# Patient Record
Sex: Female | Born: 1965 | Race: White | Hispanic: No | Smoking: Current every day smoker
Health system: Southern US, Community
[De-identification: ages and names within clinical notes are randomized; demographics above are authoritative.]

## PROBLEM LIST (undated history)

## (undated) DIAGNOSIS — F32A Depression, unspecified: Secondary | ICD-10-CM

## (undated) DIAGNOSIS — F419 Anxiety disorder, unspecified: Secondary | ICD-10-CM

## (undated) DIAGNOSIS — F329 Major depressive disorder, single episode, unspecified: Secondary | ICD-10-CM

## (undated) HISTORY — PX: ABDOMINAL HYSTERECTOMY: SHX81

## (undated) HISTORY — PX: TONSILLECTOMY: SUR1361

## (undated) HISTORY — PX: TUBAL LIGATION: SHX77

---

## 2015-03-27 ENCOUNTER — Emergency Department (HOSPITAL_BASED_OUTPATIENT_CLINIC_OR_DEPARTMENT_OTHER)
Admission: EM | Admit: 2015-03-27 | Discharge: 2015-03-27 | Disposition: A | Discharge status: Home / Self Care | Payer: Self-pay | Attending: Emergency Medicine | Admitting: Emergency Medicine

## 2015-03-27 ENCOUNTER — Emergency Department (HOSPITAL_BASED_OUTPATIENT_CLINIC_OR_DEPARTMENT_OTHER): Payer: Self-pay

## 2015-03-27 ENCOUNTER — Encounter (HOSPITAL_BASED_OUTPATIENT_CLINIC_OR_DEPARTMENT_OTHER): Payer: Self-pay

## 2015-03-27 DIAGNOSIS — Z79899 Other long term (current) drug therapy: Secondary | ICD-10-CM | POA: Insufficient documentation

## 2015-03-27 DIAGNOSIS — Y9289 Other specified places as the place of occurrence of the external cause: Secondary | ICD-10-CM | POA: Insufficient documentation

## 2015-03-27 DIAGNOSIS — Y998 Other external cause status: Secondary | ICD-10-CM | POA: Insufficient documentation

## 2015-03-27 DIAGNOSIS — W1789XA Other fall from one level to another, initial encounter: Secondary | ICD-10-CM | POA: Insufficient documentation

## 2015-03-27 DIAGNOSIS — S3097XA Unspecified superficial injury of unspecified external genital organs, female, initial encounter: Secondary | ICD-10-CM | POA: Insufficient documentation

## 2015-03-27 DIAGNOSIS — S3992XA Unspecified injury of lower back, initial encounter: Secondary | ICD-10-CM | POA: Insufficient documentation

## 2015-03-27 DIAGNOSIS — Y9389 Activity, other specified: Secondary | ICD-10-CM | POA: Insufficient documentation

## 2015-03-27 HISTORY — DX: Anxiety disorder, unspecified: F41.9

## 2015-03-27 HISTORY — DX: Depression, unspecified: F32.A

## 2015-03-27 HISTORY — DX: Major depressive disorder, single episode, unspecified: F32.9

## 2015-03-27 MED ORDER — OXYCODONE-ACETAMINOPHEN 5-325 MG PO TABS: 2.0000 | ORAL_TABLET | Freq: Once | ORAL | Status: DC

## 2015-03-27 MED ORDER — OXYCODONE-ACETAMINOPHEN 5-325 MG PO TABS: 1.0000 | ORAL_TABLET | Freq: Four times a day (QID) | ORAL | Status: AC | PRN

## 2015-03-27 NOTE — ED Provider Notes (Signed)
CSN: [id number]     Arrival date & time 03/27/15  2056 History  By signing my name below, I, Tanda Rockers, attest that this documentation has been prepared under the direction and in the presence of Elwin Mocha, MD. Electronically Signed: Tanda Rockers, ED Scribe. 03/27/2015. 10:05 PM.    Chief Complaint  Patient presents with  . Fall   Patient is a 49 y.o. female presenting with fall. The history is provided by the patient. No language interpreter was used.  Fall This is a new problem. The current episode started 2 days ago. The problem occurs rarely. The problem has not changed since onset.Pertinent negatives include no chest pain, no abdominal pain, no headaches and no shortness of breath. She has tried nothing for the symptoms. The treatment provided no relief.     HPI Comments: Takeyah Wieman is a 48 y.o. female who presents to the Emergency Department complaining of sudden onset, constant, severe, buttock pain and perineum pain s/p 5 foot fall that occurred 2 days ago. Pt was getting off of a tractor trailer when she fell and landed onto her buttocks. No head injury or LOC. She is having pain with bowel movements and urination but denies any issues. She also complains of mild lower back pain. Denies vaginal bleeding, numbness, weakness, tingling, loss of bladder or bowel.   Past Medical History  Diagnosis Date  . Anxiety   . Depression    Past Surgical History  Procedure Laterality Date  . Abdominal hysterectomy    . Tonsillectomy    . Tubal ligation     No family history on file. Social History  Substance Use Topics  . Smoking status: None  . Smokeless tobacco: None  . Alcohol Use: None   OB History    No data available     Review of Systems  Constitutional: Negative for fever and chills.  Respiratory: Negative for shortness of breath.   Cardiovascular: Negative for chest pain.  Gastrointestinal: Negative for abdominal pain.       No bowel incontinence   Genitourinary: Negative for hematuria, vaginal bleeding, difficulty urinating and vaginal pain.       + Perineum pain No urinary incontinence  Musculoskeletal: Positive for back pain and arthralgias (Buttock pain. ).  Neurological: Negative for syncope, weakness, numbness and headaches.  All other systems reviewed and are negative.   Allergies  Review of patient's allergies indicates no known allergies.  Home Medications   Prior to Admission medications   Medication Sig Start Date End Date Taking? Authorizing Provider  lisdexamfetamine (VYVANSE) 40 MG capsule Take 40 mg by mouth daily.   Yes Historical Provider, MD   Triage Vitals: BP 136/74 mmHg  Pulse 113  Temp(Src) 98.9 F (37.2 C) (Oral)  Resp 20  Ht  (1.626 m)  Wt 146 lb (66.225 kg)  BMI 25.05 kg/m2  SpO2 99%   Physical Exam  Constitutional: She is oriented to person, place, and time. She appears well-developed and well-nourished. No distress.  HENT:  Head: Normocephalic and atraumatic.  Mouth/Throat: Oropharynx is clear and moist.  Eyes: EOM are normal. Pupils are equal, round, and reactive to light.  Neck: Normal range of motion. Neck supple.  Cardiovascular: Normal rate and regular rhythm.  Exam reveals no friction rub.   No murmur heard. Pulmonary/Chest: Effort normal and breath sounds normal. No respiratory distress. She has no wheezes. She has no rales.  Abdominal: Soft. She exhibits no distension. There is no tenderness. There is no  rebound.  Musculoskeletal: Normal range of motion. She exhibits no edema.       Lumbar back: She exhibits no tenderness and no bony tenderness.       Back:  Neurological: She is alert and oriented to person, place, and time.  Skin: She is not diaphoretic.  Nursing note and vitals reviewed.   ED Course  Procedures (including critical care time)  DIAGNOSTIC STUDIES: Oxygen Saturation is 99% on RA, normal by my interpretation.    COORDINATION OF CARE: 9:33 PM-Discussed  treatment plan which includes DG Sacrum/Coccyx and pain medication with pt at bedside and pt agreed to plan.   Labs Review Labs Reviewed - No data to display  Imaging Review Dg Sacrum/coccyx  03/27/2015   CLINICAL DATA:  Fall  EXAM: SACRUM AND COCCYX - 2+ VIEW  COMPARISON:  None.  FINDINGS: No fracture.  No dislocation.  IMPRESSION: No acute bony pathology.   Electronically Signed   By: Jolaine Click M.D.   On: 03/27/2015 22:05   I have personally reviewed and evaluated these images as part of my medical decision-making.   EKG Interpretation None      MDM   Final diagnoses:  Tailbone injury, initial encounter    49 year old female here with a fall. She fell 5 feet and landed on her buttocks. She's having a lot of tailbone pain ever since. Follows 3 days ago. Denies any urinary incontinence or fecal incontinence. No urinary or fecal retention. No lumbar back pain. No numbness or tingling in her legs. She is having mostly pain in her lower coccyx and perineal area. Normal lower extremity exam. Normal strength and sensation. She has no lumbar pain. She has had pain in her upper coccyx area. X-rays normal. Given small amount pain medicine. She stable for discharge. No concerns for cord compression  I personally performed the services described in this documentation, which was scribed in my presence. The recorded information has been reviewed and is accurate.      Elwin Mocha, MD 03/27/15 [phone]

## 2015-03-27 NOTE — ED Notes (Signed)
percocet order canceled d/t no ride

## 2015-03-27 NOTE — ED Notes (Signed)
Pt fell on Monday from a tractor trailer approximately 5 feet onto her bottom on Monday, c/o severe pain in butt and back, steady gait, but obvious pain with ambulation.  No incontinence, no numbness in extremities.

## 2017-04-27 ENCOUNTER — Emergency Department (HOSPITAL_BASED_OUTPATIENT_CLINIC_OR_DEPARTMENT_OTHER): Payer: Self-pay

## 2017-04-27 ENCOUNTER — Emergency Department (HOSPITAL_BASED_OUTPATIENT_CLINIC_OR_DEPARTMENT_OTHER)
Admission: EM | Admit: 2017-04-27 | Discharge: 2017-04-27 | Disposition: A | Discharge status: Home / Self Care | Payer: Self-pay | Attending: Emergency Medicine | Admitting: Emergency Medicine

## 2017-04-27 ENCOUNTER — Encounter (HOSPITAL_BASED_OUTPATIENT_CLINIC_OR_DEPARTMENT_OTHER): Payer: Self-pay | Admitting: *Deleted

## 2017-04-27 ENCOUNTER — Other Ambulatory Visit: Payer: Self-pay

## 2017-04-27 DIAGNOSIS — S20212A Contusion of left front wall of thorax, initial encounter: Secondary | ICD-10-CM | POA: Insufficient documentation

## 2017-04-27 DIAGNOSIS — Z791 Long term (current) use of non-steroidal anti-inflammatories (NSAID): Secondary | ICD-10-CM | POA: Insufficient documentation

## 2017-04-27 DIAGNOSIS — Y939 Activity, unspecified: Secondary | ICD-10-CM | POA: Insufficient documentation

## 2017-04-27 DIAGNOSIS — Y999 Unspecified external cause status: Secondary | ICD-10-CM | POA: Insufficient documentation

## 2017-04-27 DIAGNOSIS — F17200 Nicotine dependence, unspecified, uncomplicated: Secondary | ICD-10-CM | POA: Insufficient documentation

## 2017-04-27 DIAGNOSIS — Z79899 Other long term (current) drug therapy: Secondary | ICD-10-CM | POA: Insufficient documentation

## 2017-04-27 DIAGNOSIS — Y92410 Unspecified street and highway as the place of occurrence of the external cause: Secondary | ICD-10-CM | POA: Insufficient documentation

## 2017-04-27 LAB — CBC
HCT: 38.4 % (ref 36.0–46.0)
Hemoglobin: 12.6 g/dL (ref 12.0–15.0)
MCH: 31.3 pg (ref 26.0–34.0)
MCHC: 32.8 g/dL (ref 30.0–36.0)
MCV: 95.5 fL (ref 78.0–100.0)
PLATELETS: 392 10*3/uL (ref 150–400)
RBC: 4.02 MIL/uL (ref 3.87–5.11)
RDW: 13.1 % (ref 11.5–15.5)
WBC: 7.8 10*3/uL (ref 4.0–10.5)

## 2017-04-27 LAB — BASIC METABOLIC PANEL
Anion gap: 8 (ref 5–15)
BUN: 15 mg/dL (ref 6–20)
CALCIUM: 9.8 mg/dL (ref 8.9–10.3)
CHLORIDE: 102 mmol/L (ref 101–111)
CO2: 26 mmol/L (ref 22–32)
CREATININE: 0.83 mg/dL (ref 0.44–1.00)
GFR calc non Af Amer: >60 mL/min (ref >60)
GLUCOSE: 90 mg/dL (ref 65–99)
Potassium: 3.6 mmol/L (ref 3.5–5.1)
Sodium: 136 mmol/L (ref 135–145)

## 2017-04-27 LAB — TROPONIN I

## 2017-04-27 NOTE — ED Notes (Signed)
Patient states that this workers comp. The patient states that she does not need a UDS

## 2017-04-27 NOTE — ED Notes (Signed)
Patient called this RN into the rooms ' Very paniced and appeared to be anxious states " someone needs to do something about this chest pain, everyone in my family under the age of 50 has had a massive heart attack" - Patient is rubbing her chest and states that it radiates up into her right shoulder. The patient states "I dont care what those things say I have chest pain"
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
## 2017-06-24 ENCOUNTER — Other Ambulatory Visit: Payer: Self-pay

## 2017-06-24 ENCOUNTER — Encounter (HOSPITAL_BASED_OUTPATIENT_CLINIC_OR_DEPARTMENT_OTHER): Payer: Self-pay | Admitting: *Deleted

## 2017-06-24 ENCOUNTER — Emergency Department (HOSPITAL_BASED_OUTPATIENT_CLINIC_OR_DEPARTMENT_OTHER)
Admission: EM | Admit: 2017-06-24 | Discharge: 2017-06-24 | Disposition: A | Discharge status: Home / Self Care | Payer: Self-pay | Attending: Emergency Medicine | Admitting: Emergency Medicine

## 2017-06-24 ENCOUNTER — Emergency Department (HOSPITAL_BASED_OUTPATIENT_CLINIC_OR_DEPARTMENT_OTHER): Payer: Self-pay

## 2017-06-24 DIAGNOSIS — Z79899 Other long term (current) drug therapy: Secondary | ICD-10-CM | POA: Insufficient documentation

## 2017-06-24 DIAGNOSIS — F329 Major depressive disorder, single episode, unspecified: Secondary | ICD-10-CM | POA: Insufficient documentation

## 2017-06-24 DIAGNOSIS — F17200 Nicotine dependence, unspecified, uncomplicated: Secondary | ICD-10-CM | POA: Insufficient documentation

## 2017-06-24 DIAGNOSIS — B349 Viral infection, unspecified: Secondary | ICD-10-CM | POA: Insufficient documentation

## 2017-06-24 DIAGNOSIS — F419 Anxiety disorder, unspecified: Secondary | ICD-10-CM | POA: Insufficient documentation

## 2017-06-24 MED ORDER — BENZONATATE 100 MG PO CAPS
200.0000 mg | ORAL_CAPSULE | Freq: Once | ORAL | Status: AC
  Administered 2017-06-24: 200 mg via ORAL
  Filled 2017-06-24: qty 2

## 2017-06-24 MED ORDER — IPRATROPIUM-ALBUTEROL 0.5-2.5 (3) MG/3ML IN SOLN
3.0000 mL | Freq: Once | RESPIRATORY_TRACT | Status: AC
  Administered 2017-06-24: 3 mL via RESPIRATORY_TRACT

## 2017-06-24 MED ORDER — PREDNISONE 10 MG (21) PO TBPK: ORAL_TABLET | ORAL | 0 refills | Status: AC

## 2017-06-24 MED ORDER — ALBUTEROL SULFATE HFA 108 (90 BASE) MCG/ACT IN AERS: 2.0000 | INHALATION_SPRAY | RESPIRATORY_TRACT | 0 refills | Status: AC | PRN

## 2017-06-24 MED ORDER — PREDNISONE 50 MG PO TABS
60.0000 mg | ORAL_TABLET | Freq: Once | ORAL | Status: AC
  Administered 2017-06-24: 60 mg via ORAL
  Filled 2017-06-24: qty 1

## 2017-06-24 MED ORDER — BENZONATATE 100 MG PO CAPS: 100.0000 mg | ORAL_CAPSULE | Freq: Three times a day (TID) | ORAL | 0 refills | Status: AC

## 2017-06-24 NOTE — Discharge Instructions (Addendum)
Your symptoms are consistent with a viral illness. Viruses do not require antibiotics. Treatment is symptomatic care and it is important to note that these symptoms may last for 7-14 days.   Hand washing: Wash your hands throughout the day, but especially before and after touching the face, using the restroom, sneezing, coughing, or touching surfaces that have been coughed or sneezed upon. Hydration: Symptoms will be intensified and complicated by dehydration. Dehydration can also extend the duration of symptoms. Drink plenty of fluids and get plenty of rest. You should be drinking at least half a liter of water an hour to stay hydrated. Electrolyte drinks (ex. Gatorade, Powerade, Pedialyte) are also encouraged. You should be drinking enough fluids to make your urine light yellow, almost clear. If this is not the case, you are not drinking enough water. Please note that some of the treatments indicated below will not be effective if you are not adequately hydrated. Diet: Please concentrate on hydration, however, you may introduce food slowly.  Start with a clear liquid diet, progressed to a full liquid diet, and then bland solids as you are able. Pain or fever: Ibuprofen, Naproxen, or Tylenol for pain or fever.  Cough: Use the Tessalon for cough.  Albuterol: Take the albuterol, as needed, for minor shortness of breath. Prednisone: Take the prednisone, as directed, in its entirety. Zyrtec or Claritin: May add these medication daily to control underlying symptoms of congestion, sneezing, and other signs of allergies. Flonase: Use this medication, as directed, for nasal and sinus congestion. Congestion: Plain Mucinex may help relieve congestion. Saline sinus rinses and saline nasal sprays may also help relieve congestion. If you do not have heart problems or an allergy to such medications, you may also try phenylephrine or Sudafed. Sore throat: Warm liquids or Chloraseptic spray may help soothe a sore  throat. Gargle twice a day with a salt water solution made from a half teaspoon of salt in a cup of warm water.  Follow up: Follow up with a primary care provider, as needed, for any future management of this issue. Return: Return to the ED immediately for worsening symptoms including shortness of breath, chest pain, or other major concerns.

## 2017-06-24 NOTE — ED Triage Notes (Signed)
Pt c/o URi symptoms  x 1 week DX pneumonia x 1 week ago at PMD completed ABX , prednisone

## 2017-06-24 NOTE — ED Provider Notes (Signed)
[facility] EMERGENCY DEPARTMENT Provider Note   CSN: [id number] Arrival date & time: 06/24/17  1517     History   Chief Complaint Chief Complaint  Patient presents with  . URI    HPI Janaa Acero is a 52 y.o. female.  HPI   Amneet Cendejas is a 52 y.o. female, with a history of anxiety and depression, presenting to the ED with persistent cough for the last week. Accompanied by subjective fever, body aches, and nasal congestion.  Saw her PCP last week, was clinically diagnosed with pneumonia, and prescribed azithromycin, prednisone burst, and albuterol nebulizer. States she felt like she was getting better and then symptoms worsened. Poor fluid intake. Has been using the albuterol inhaler twice a day.   Denies N/V/D, shortness of breath, CP, abdominal pain, rash, peripheral edema, or any other complaints.    Past Medical History:  Diagnosis Date  . Anxiety   . Depression     There are no active problems to display for this patient.   Past Surgical History:  Procedure Laterality Date  . ABDOMINAL HYSTERECTOMY    . TONSILLECTOMY    . TUBAL LIGATION      OB History    No data available       Home Medications    Prior to Admission medications   Medication Sig Start Date End Date Taking? Authorizing Provider  albuterol (PROVENTIL HFA;VENTOLIN HFA) 108 (90 Base) MCG/ACT inhaler Inhale 2 puffs into the lungs every 4 (four) hours as needed for wheezing or shortness of breath. 06/24/17   Williamson Cavanah C, PA-C  benzonatate (TESSALON) 100 MG capsule Take 1 capsule (100 mg total) by mouth every 8 (eight) hours. 06/24/17   Sylvan Lahm C, PA-C  FLUoxetine HCl (PROZAC PO) Take by mouth.    [provider]  lisdexamfetamine (VYVANSE) 40 MG capsule Take 40 mg by mouth daily.    [provider]  Meloxicam (MOBIC PO) Take by mouth.    [provider]  oxyCODONE-acetaminophen (PERCOCET) 5-325 MG tablet Take 1 tablet by mouth every 6 (six) hours as  needed for moderate pain. 03/27/15   Elwin Mocha, MD  predniSONE (STERAPRED UNI-PAK 21 TAB) 10 MG (21) TBPK tablet Take 6 tabs (60mg ) on day 1, 5 tabs (50mg ) on day 2, 4 tabs (40mg ) on day 3, 3 tabs (30mg ) on day 4, 2 tabs (20mg ) on day 5, and 1 tab (10mg ) on day 6. 06/24/17   Halei Hanover, Hillard Danker, PA-C    Family History History reviewed. No pertinent family history.  Social History Social History   Tobacco Use  . Smoking status: Current Every Day Smoker    Packs/day: 1.00  . Smokeless tobacco: Never Used  Substance Use Topics  . Alcohol use: Yes  . Drug use: No     Allergies   Patient has no known allergies.   Review of Systems Review of Systems  Constitutional: Positive for chills and fever (subjective).  Respiratory: Positive for cough. Negative for shortness of breath.   Cardiovascular: Negative for chest pain.  Gastrointestinal: Negative for abdominal pain, diarrhea, nausea and vomiting.  Musculoskeletal: Positive for myalgias.  All other systems reviewed and are negative.    Physical Exam Updated Vital Signs BP (!) 142/99   Pulse (!) 104   Temp 98.7 F (37.1 C) (Oral)   Resp 16   Ht 5\' 4"  (1.626 m)   Wt 63.5 kg (140 lb)   SpO2 97%   BMI 24.03 kg/m  Physical Exam  Constitutional: She appears well-developed and well-nourished. No distress.  HENT:  Head: Normocephalic and atraumatic.  Eyes: Conjunctivae are normal.  Neck: Neck supple.  Cardiovascular: Normal rate, regular rhythm, normal heart sounds and intact distal pulses.  Pulmonary/Chest: Effort normal. No respiratory distress. She has wheezes (global).  Coughing, but no increased work of breathing. Speaks in full sentences without difficulty.   Abdominal: Soft. There is no tenderness. There is no guarding.  Musculoskeletal: She exhibits no edema.  Lymphadenopathy:    She has no cervical adenopathy.  Neurological: She is alert.  Skin: Skin is warm and dry. She is not diaphoretic.  Psychiatric: She has a  normal mood and affect. Her behavior is normal.  Nursing note and vitals reviewed.    ED Treatments / Results  Labs (all labs ordered are listed, but only abnormal results are displayed) Labs Reviewed - No data to display  EKG  EKG Interpretation None       Radiology Dg Chest 2 View  Result Date: 06/24/2017 CLINICAL DATA:  Cough.  Dyspnea.  Recent pneumonia. EXAM: CHEST  2 VIEW COMPARISON:  04/27/2017 chest radiograph. FINDINGS: Stable cardiomediastinal silhouette with normal heart size. No pneumothorax. No pleural effusion. Lungs appear clear, with no acute consolidative airspace disease and no pulmonary edema. IMPRESSION: No active cardiopulmonary disease. Electronically Signed   By: Delbert Phenix M.D.   On: 06/24/2017 15:52    Procedures Procedures (including critical care time)  Medications Ordered in ED Medications  ipratropium-albuterol (DUONEB) 0.5-2.5 (3) MG/3ML nebulizer solution 3 mL (3 mLs Nebulization Given 06/24/17 1735)  predniSONE (DELTASONE) tablet 60 mg (60 mg Oral Given 06/24/17 1727)  benzonatate (TESSALON) capsule 200 mg (200 mg Oral Given 06/24/17 1727)     Initial Impression / Assessment and Plan / ED Course  I have reviewed the triage vital signs and the nursing notes.  Pertinent labs & imaging results that were available during my care of the patient were reviewed by me and considered in my medical decision making (see chart for details).  Clinical Course as of Jun 26 43  Morey Hummingbird Jun 24, 2017  1810 Patient states she feels better with her breathing following the DuoNeb treatment.  Wheezing has resolved.  Patient states she is ready for discharge.  [SJ]    Clinical Course User Index [SJ] Essa Wenk C, PA-C   Patient presents with congestion and cough.  Has already completed a course of antibiotics.  Suspect viral origin.  Patient is nontoxic appearing, afebrile, not tachycardic, not tachypneic, not hypotensive, maintains SPO2 of 98-100% on room air, and is  in no apparent distress. PCP follow-up. The patient was given instructions for home care as well as return precautions. Patient voices understanding of these instructions, accepts the plan, and is comfortable with discharge.     Vitals:   06/24/17 1740 06/24/17 1741 06/24/17 1813 06/24/17 1815  BP:    122/76  Pulse: 92 97 92   Resp:      Temp:      TempSrc:      SpO2: 100% 100% 98%   Weight:      Height:         Final Clinical Impressions(s) / ED Diagnoses   Final diagnoses:  Viral syndrome    ED Discharge Orders        Ordered    benzonatate (TESSALON) 100 MG capsule  Every 8 hours     06/24/17 1813    albuterol (PROVENTIL HFA;VENTOLIN HFA) 108 (90  Base) MCG/ACT inhaler  Every 4 hours PRN     06/24/17 1813    predniSONE (STERAPRED UNI-PAK 21 TAB) 10 MG (21) TBPK tablet     06/24/17 1813       Anselm PancoastJoy, Cuahutemoc Attar C, PA-C 06/25/17 0047    Rolland PorterJames, Mark, MD 06/26/17 2229

## 2019-09-11 IMAGING — CR DG CHEST 2V
2 series · 2 of 2 positions shown · non-contrast
Comparison: 04/27/2017 chest radiograph.

CLINICAL DATA: Cough.  Dyspnea.  Recent pneumonia.

EXAM:
CHEST  2 VIEW

[w chest pa]
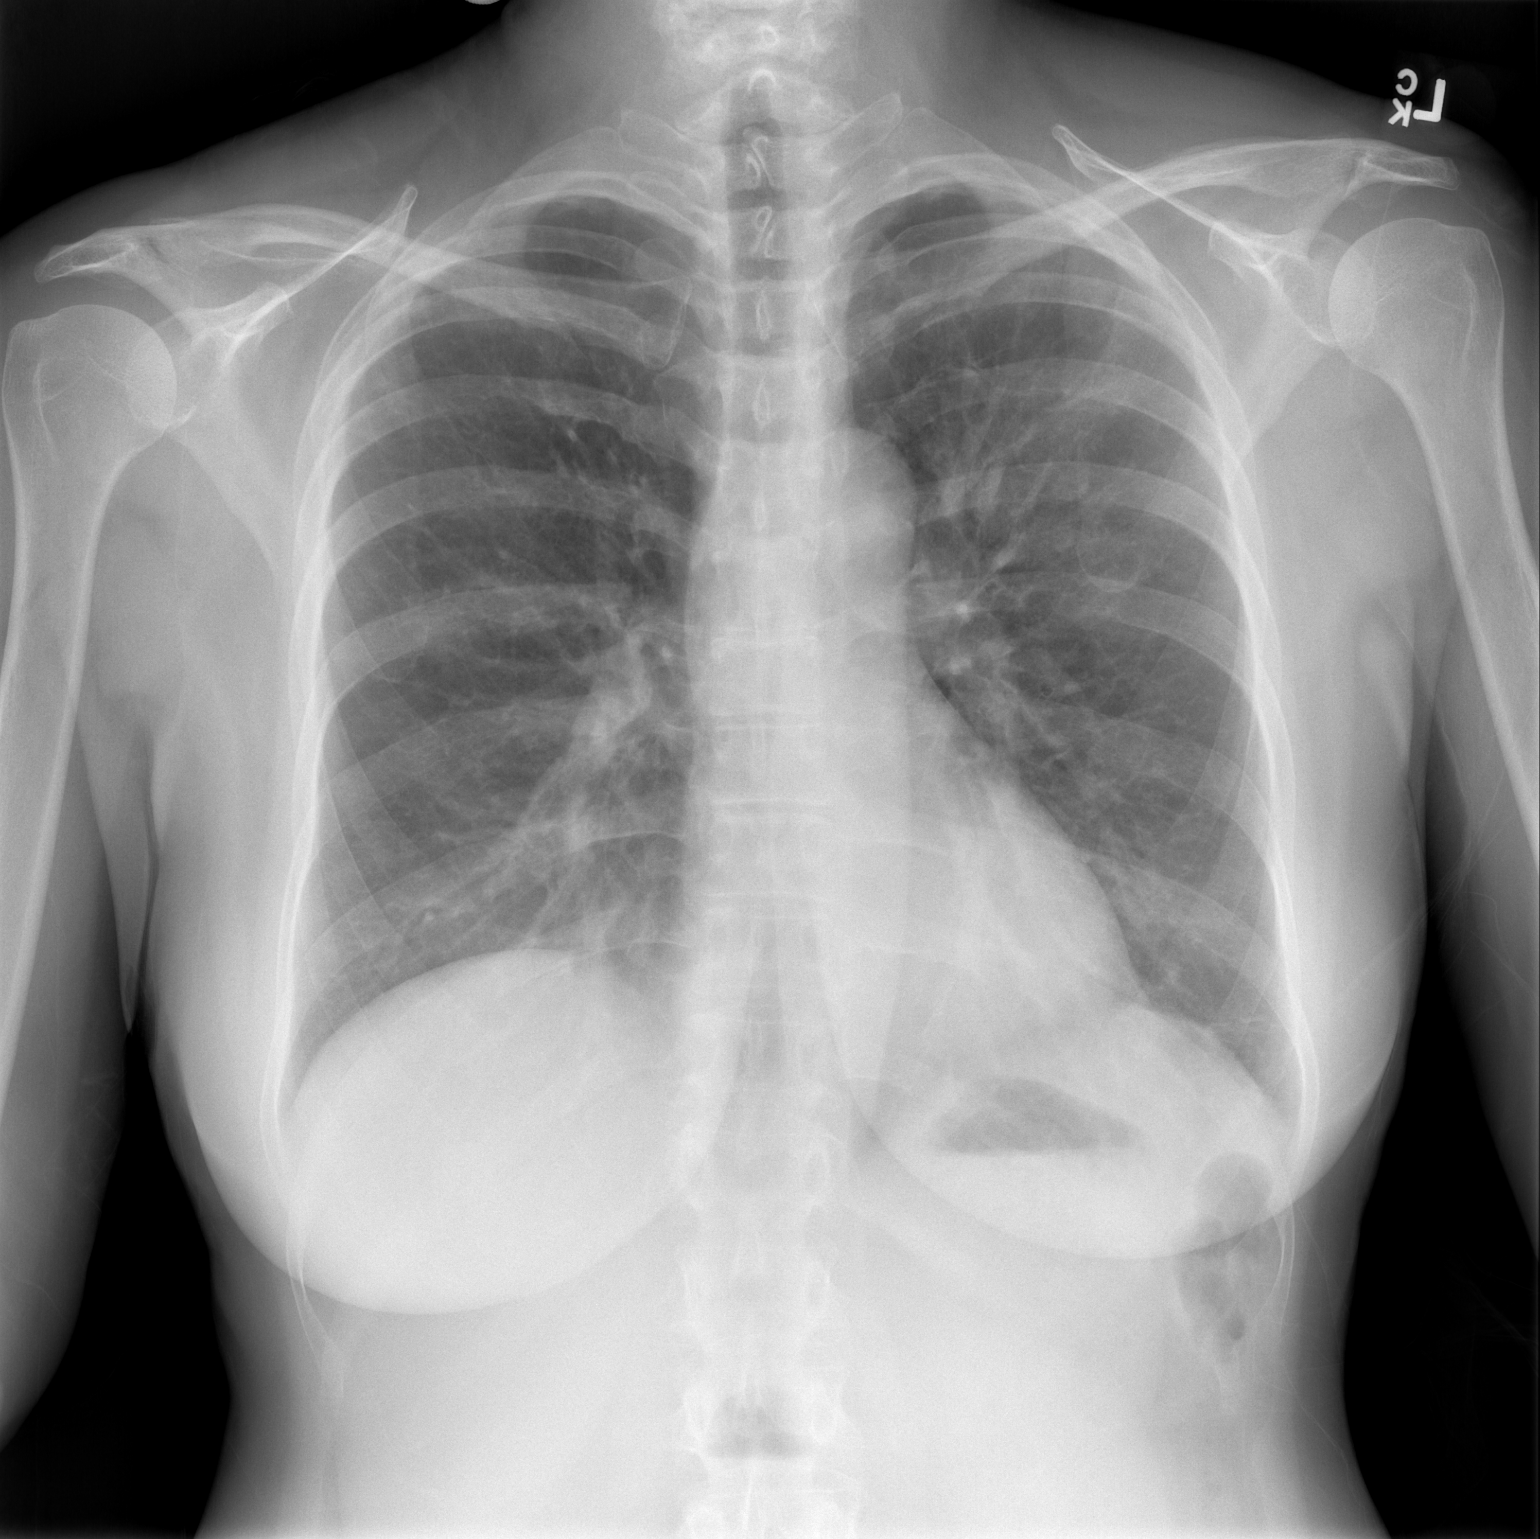

[w chest lat]
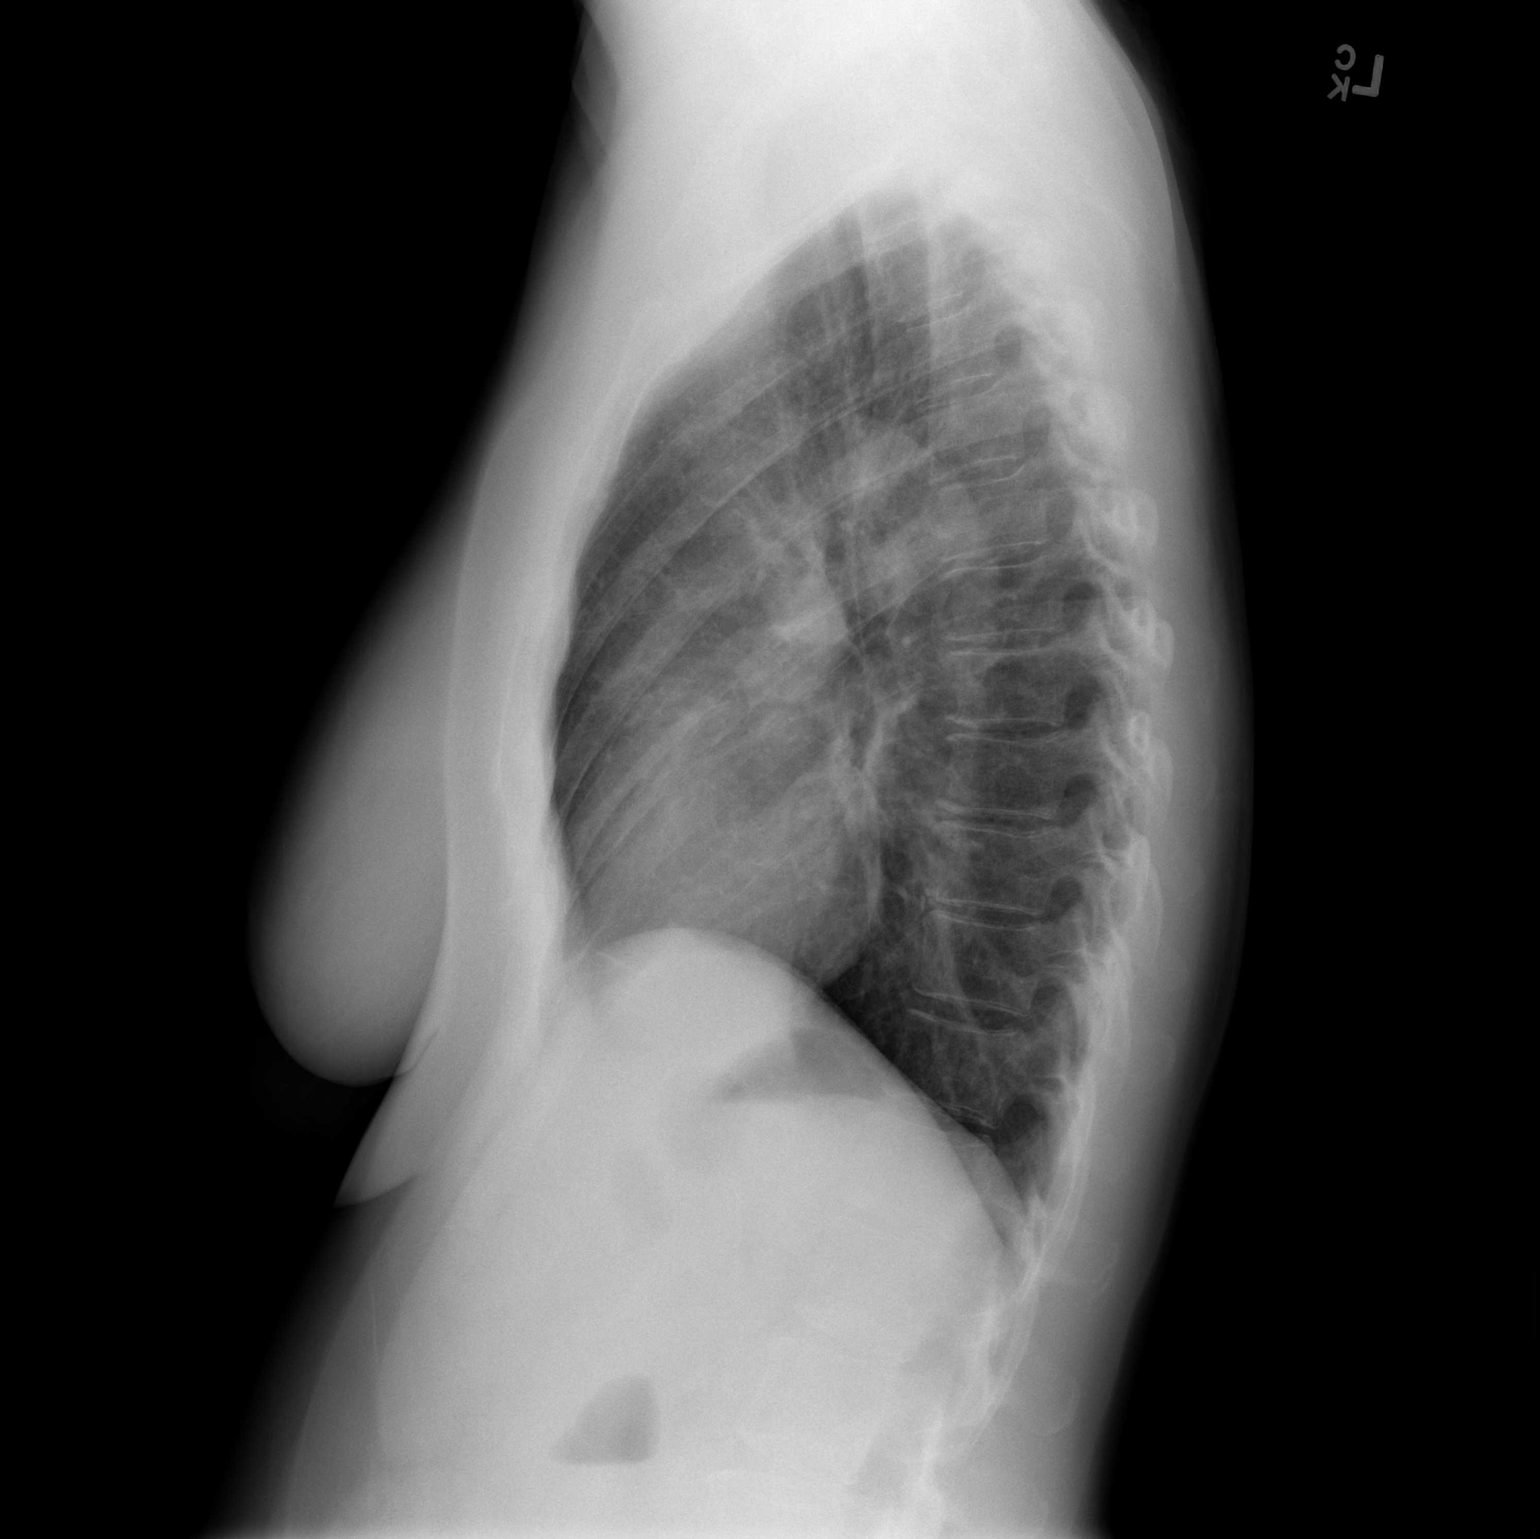

[2 of 2 positions shown; findings below may reference images not displayed]

FINDINGS: Stable cardiomediastinal silhouette with normal heart size. No
pneumothorax. No pleural effusion. Lungs appear clear, with no acute
consolidative airspace disease and no pulmonary edema.
IMPRESSION: No active cardiopulmonary disease.
